# Patient Record
Sex: Male | Born: 2000 | Race: White | Hispanic: No | Marital: Single | Smoking: Never smoker
Health system: Southern US, Community
[De-identification: ages and names within clinical notes are randomized; demographics above are authoritative.]

## PROBLEM LIST (undated history)

## (undated) HISTORY — PX: FEMUR FRACTURE SURGERY: SHX633

---
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
## 2023-05-10 ENCOUNTER — Ambulatory Visit (HOSPITAL_BASED_OUTPATIENT_CLINIC_OR_DEPARTMENT_OTHER): Payer: BC Managed Care – PPO | Admitting: Radiology

## 2023-05-10 ENCOUNTER — Ambulatory Visit (HOSPITAL_BASED_OUTPATIENT_CLINIC_OR_DEPARTMENT_OTHER)
Admission: RE | Admit: 2023-05-10 | Discharge: 2023-05-10 | Disposition: A | Discharge status: Home / Self Care | Payer: BC Managed Care – PPO | Source: Ambulatory Visit

## 2023-05-10 ENCOUNTER — Encounter (HOSPITAL_BASED_OUTPATIENT_CLINIC_OR_DEPARTMENT_OTHER): Payer: Self-pay

## 2023-05-10 ENCOUNTER — Other Ambulatory Visit (HOSPITAL_BASED_OUTPATIENT_CLINIC_OR_DEPARTMENT_OTHER): Payer: Self-pay

## 2023-05-10 VITALS — BP 138/82 | HR 78 | Temp 98.4°F | Resp 20

## 2023-05-10 DIAGNOSIS — M5442 Lumbago with sciatica, left side: Secondary | ICD-10-CM

## 2023-05-10 MED ORDER — METHOCARBAMOL 500 MG PO TABS
500.0000 mg | ORAL_TABLET | Freq: Two times a day (BID) | ORAL | 0 refills | Status: AC
  Filled 2023-05-10: qty 20, 10d supply, fill #0

## 2023-05-10 MED ORDER — NAPROXEN 500 MG PO TABS
500.0000 mg | ORAL_TABLET | Freq: Two times a day (BID) | ORAL | 0 refills | Status: AC
  Filled 2023-05-10: qty 30, 15d supply, fill #0
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
## 2023-05-10 NOTE — Discharge Instructions (Signed)
 I did not see anything broken or out of place on your x-ray.  I will contact you if the radiologist sees something I did not.  Start methocarbamol  up to twice a day.  This will make you sleepy so do not drive drink alcohol while taking it.  Take Naprosyn  twice a day.  Do not take NSAIDs with this medication including aspirin, ibuprofen /Advil , naproxen /Aleve .  You can use Tylenol/acetaminophen as needed for additional symptom relief.  If symptoms or not improving within a few days please follow-up with [facility]; call to schedule an appointment.  If anything worsens and you have difficulty walking, numbness or tingling in your legs, going to the bathroom on yourself without noticing it you need to go to the emergency room.
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
